# Patient Record
Sex: Male | Born: 1990 | Race: White | Hispanic: No | Marital: Single
Health system: Southern US, Community
[De-identification: ages and names within clinical notes are randomized; demographics above are authoritative.]

---
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
## 2013-08-11 ENCOUNTER — Ambulatory Visit: Payer: Self-pay | Admitting: Orthopedic Surgery

## 2014-05-30 NOTE — Op Note (Signed)
PATIENT NAME:  Wesley Oconnell, Wesley Oconnell MR#:  [id number] DATE OF BIRTH:  [birth year]  DATE OF PROCEDURE:  01/19/2011  PREOPERATIVE DIAGNOSIS: Right inguinal hernia, epidermal cyst.   POSTOPERATIVE DIAGNOSIS: Right inguinal hernia, epidermal cyst.   PROCEDURES: 1. Left inguinal hernia repair.  2. Excision epidermal cyst.   SURGEON: J. Renda RollsWilton Jhovanny Guinta, MD   ANESTHESIA: General.   INDICATIONS: This 24 year old male has an 18 month history of bulging in the left groin and hernia was demonstrated on physical exam. He had also had a small skin lesion in the lower abdomen for which excision was recommended.   DESCRIPTION OF PROCEDURE: The patient was placed on the operating table in the supine position under general anesthesia. The abdomen was prepared with ChloraPrep and draped in a sterile manner.   A left lower quadrant transversely oriented suprapubic incision was made, carried down through subcutaneous tissues. Several small bleeding points were cauterized. The Scarpa's fascia was incised. The external oblique aponeurosis was incised along the course of its fibers to expose the inguinal cord structures and open the external ring. The cord structures were mobilized. Cremaster fibers were spread to expose an indirect hernia sac which was dissected free from surrounding structures. It was approximately four inches in length and the small caliber portion near its distal end was transected and then it was dissected up well into the internal ring where a high ligation of the sac was with a 0 Vicryl suture ligature. The sac was excised. The stump was allowed to retract. It did not appear to need pathological examination. Next, the floor of the inguinal canal was reinforced with a row of 0 Surgilon sutures suturing the conjoined tendon to the shelving edge of the inguinal ligament. Next, an onlay Atrium mesh was cut to create an oval shape of some 2.5 x 4 cm in length. Notch was cut out for the cord structures. This was  placed along the floor of the inguinal canal with a notch straddling the cord structures. The mesh was sewn into place with 0 Surgilon sutures. The repair looked good. The ilioinguinal nerve and cord structures were replaced along the floor of the inguinal canal. Cut edges of the external oblique aponeurosis were closed with running 4-0 Vicryl to recreate the external ring. The deep fascia superior and lateral to the repair site was infiltrated with 0.5% Sensorcaine with epinephrine. Also, subcutaneous tissues were infiltrated as well. Scarpa's fascia was closed with interrupted 4-0 chromic and the skin was closed with running 4-0 chromic subcuticular suture.   Next, there was an epidermal cyst which was just about two inches away in the lower abdominal midline. Cyst appeared to be about 7 mm in dimension. A transversely oriented elliptical excision was carried out and dissected down around the cyst and completely excised it. Several small bleeding points were cauterized. It did not appear to need pathological examination as it did not appear to be suspicious for cancer. The wound was closed with 4-0 chromic subcuticular suture. Next, both wounds were treated with Dermabond.   The patient tolerated surgery satisfactorily and was then prepared for transfer to the recovery room.  ____________________________ Shela CommonsJ. Renda RollsWilton Oluwadarasimi Favor, MD [email or web address] D: 01/19/2011 09:15:46 ET T: 01/19/2011 09:38:46 ET JOB#: [id number]  cc: Adella HareJ. Wilton Latysha Thackston, MD, <Dictator> Adella HareWILTON J Dierdre Mccalip MD ELECTRONICALLY SIGNED 02/10/2011 17:34

## 2016-01-09 IMAGING — MR MR ARTHROGRAM SHOULDER*L*
6 series · 40 of 40 positions shown · non-contrast
Comparison: 08/11/2013.

CLINICAL DATA: LEFT shoulder pain. Decreased range of motion.
Symptoms for 7 months.

EXAM:
MR ARTHROGRAM OF THE LEFT SHOULDER
TECHNIQUE: Multiplanar, multisequence MR imaging of the LEFT shoulder was
performed following the administration of intra-articular contrast.

[Series 3: T1 fat-sat · axial · 4.0mm · 0.47mm/px · z∈[-17,+75]mm · 8 of 22 slices shown (1 of 4)]
[im 1/22]
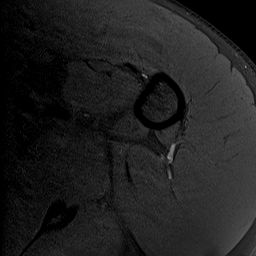
[im 4/22]
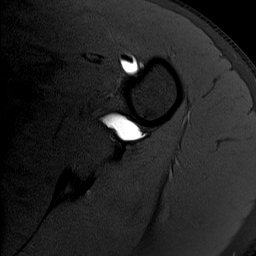
[im 7/22]
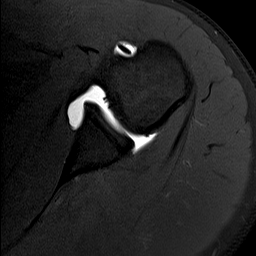
[im 10/22]
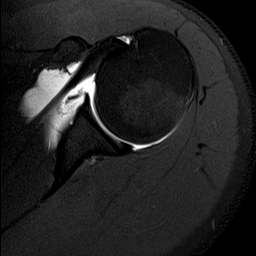
[im 13/22]
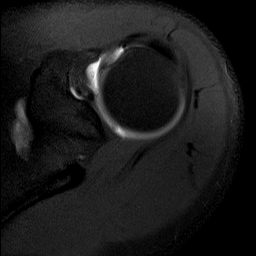
[im 16/22]
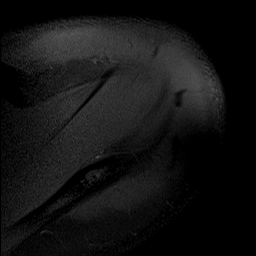
[im 19/22]
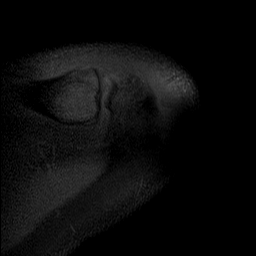
[im 22/22]
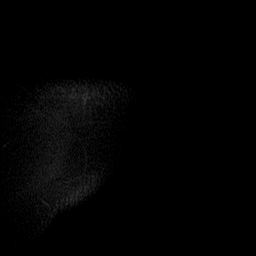

[Series 4: T1 fat-sat · oblique · 4.0mm · 0.62mm/px · 7 of 19 slices shown (2 of 4)]
[im 1/19]
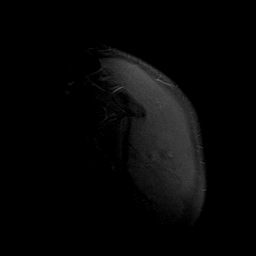
[im 4/19]
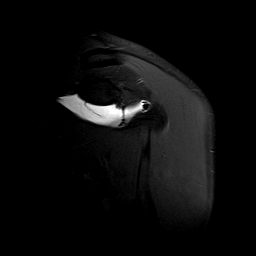
[im 7/19]
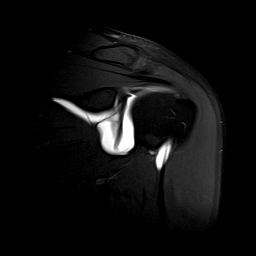
[im 10/19]
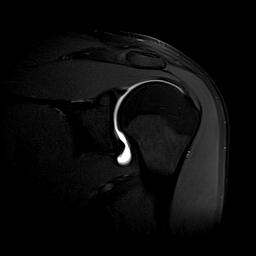
[im 13/19]
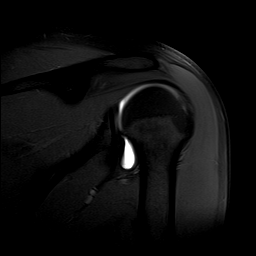
[im 16/19]
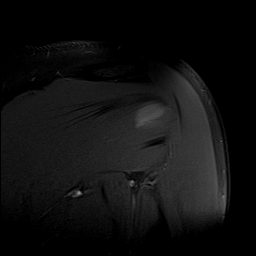
[im 19/19]
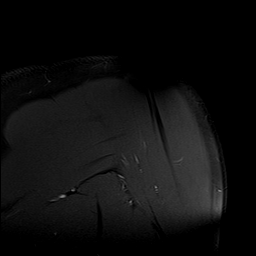

[Series 5: T2 fat-sat · oblique · 4.0mm · 0.62mm/px · 6 of 19 slices shown (1 of 2)]
[im 1/19]
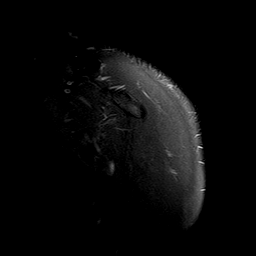
[im 4/19]
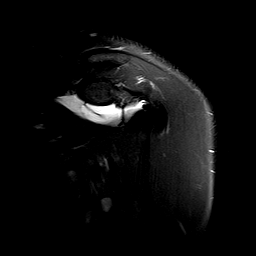
[im 8/19]
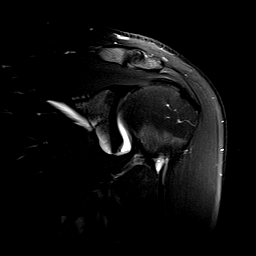
[im 11/19]
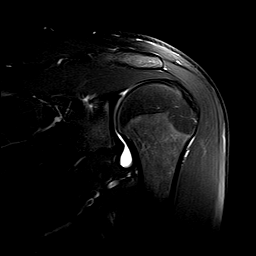
[im 15/19]
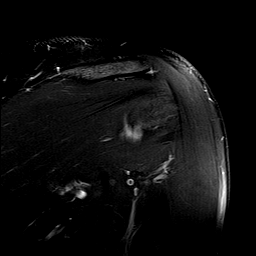
[im 19/19]
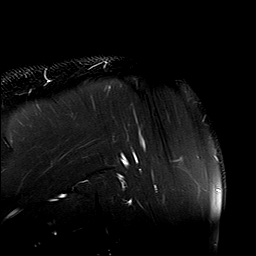

[Series 6: T1 fat-sat · oblique · non-contrast · 4.0mm · 0.42mm/px · 6 of 19 slices shown (3 of 4)]
[im 1/19]
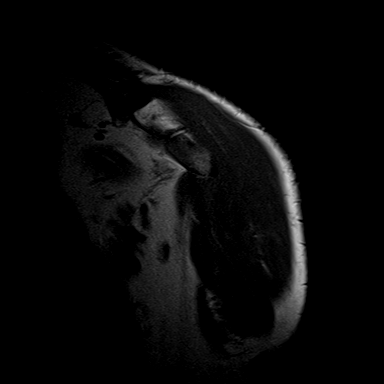
[im 4/19]
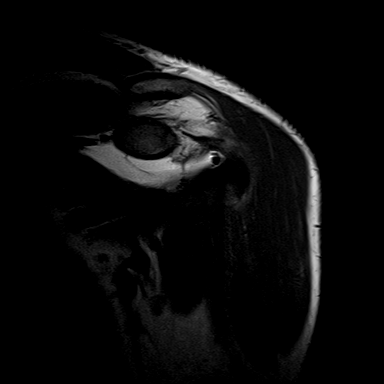
[im 8/19]
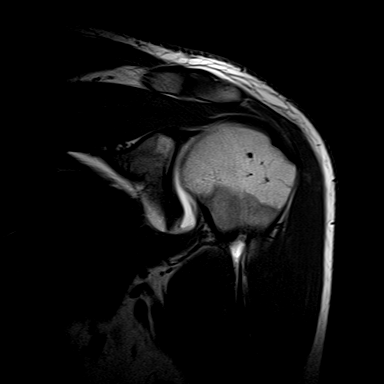
[im 11/19]
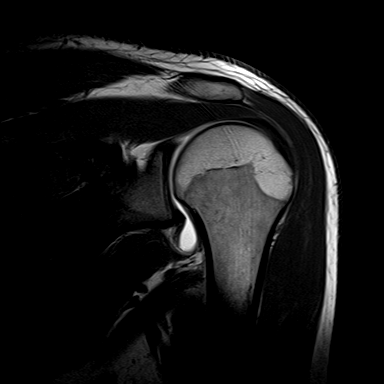
[im 15/19]
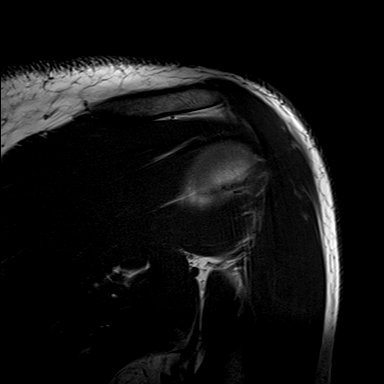
[im 19/19]
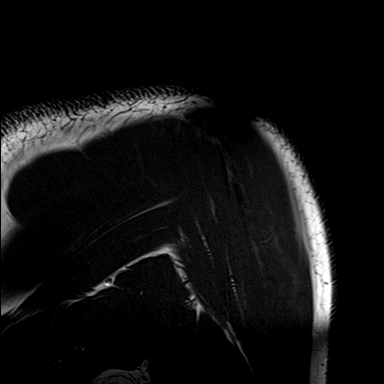

[Series 7: T2 fat-sat · oblique · 4.0mm · 0.62mm/px · 6 of 19 slices shown (2 of 2)]
[im 1/19]
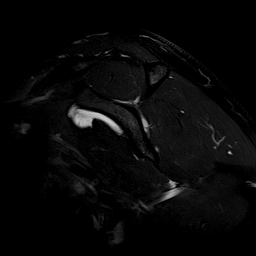
[im 4/19]
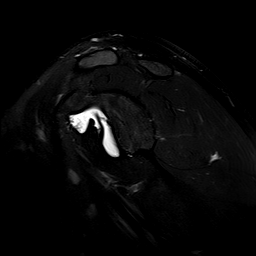
[im 8/19]
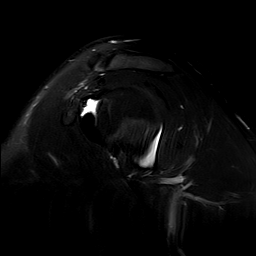
[im 11/19]
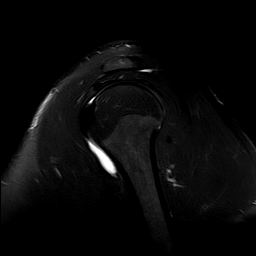
[im 15/19]
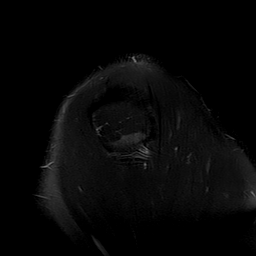
[im 19/19]
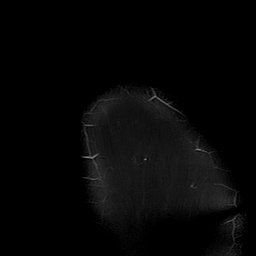

[Series 10: T1 fat-sat · sagittal · 4.0mm · 0.62mm/px · 7 of 22 slices shown (4 of 4)]
[im 1/22]
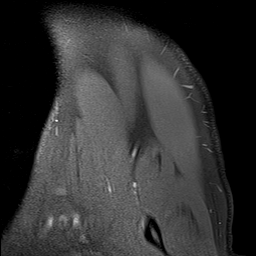
[im 4/22]
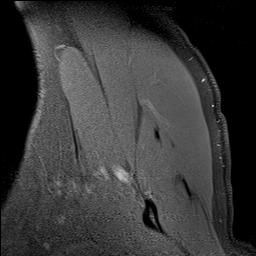
[im 8/22]
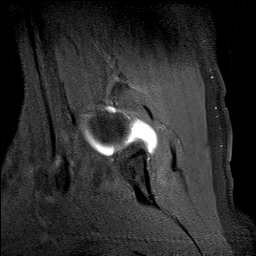
[im 11/22]
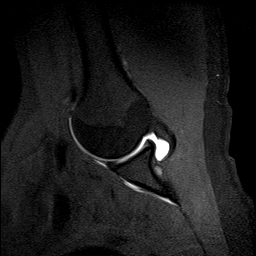
[im 15/22]
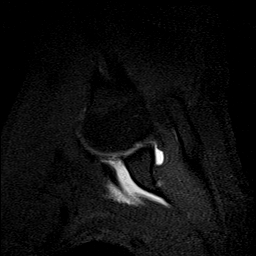
[im 18/22]
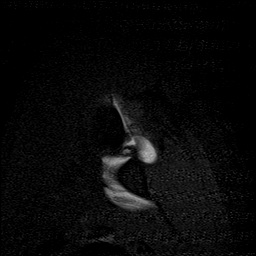
[im 22/22]
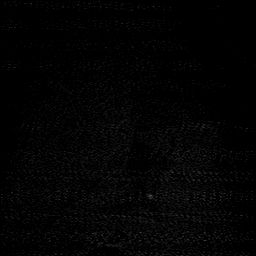

[40 of 40 positions shown; findings below may reference images not displayed]

FINDINGS: Joint is well distended with contrast.

Rotator cuff:  Normal.  No tendinopathy or tear.

Muscles:  No atrophy or edema.

Biceps long head:  Normal.

Acromioclavicular Joint: Type 2 acromion. No AC joint
osteoarthritis. Minimal subacromial bursitis.

Glenohumeral Joint: Normal. No cartilage defects. Glenohumeral
ligaments are intact.

Labrum: Normal. Abduction external rotation view performed which
appears normal.

Bones:  Normal.
IMPRESSION: Intact rotator cuff and labrum.  Mild subacromial bursitis.

## 2018-12-22 ENCOUNTER — Other Ambulatory Visit: Payer: Self-pay

## 2018-12-22 DIAGNOSIS — Z20822 Contact with and (suspected) exposure to covid-19: Secondary | ICD-10-CM

## 2018-12-24 LAB — NOVEL CORONAVIRUS, NAA: SARS-CoV-2, NAA: NOT DETECTED
